# Patient Record
Sex: Female | Born: 1995 | Race: Black or African American | Hispanic: No | Marital: Single | Smoking: Never smoker
Health system: Southern US, Community
[De-identification: ages and names within clinical notes are randomized; demographics above are authoritative.]

---

## 2021-08-09 ENCOUNTER — Emergency Department
Admission: EM | Admit: 2021-08-09 | Discharge: 2021-08-09 | Disposition: A | Discharge status: Home / Self Care | Payer: 59 | Attending: Emergency Medicine | Admitting: Emergency Medicine

## 2021-08-09 ENCOUNTER — Emergency Department: Payer: 59

## 2021-08-09 ENCOUNTER — Other Ambulatory Visit: Payer: Self-pay

## 2021-08-09 DIAGNOSIS — T50901A Poisoning by unspecified drugs, medicaments and biological substances, accidental (unintentional), initial encounter: Secondary | ICD-10-CM | POA: Diagnosis present

## 2021-08-09 DIAGNOSIS — Y900 Blood alcohol level of less than 20 mg/100 ml: Secondary | ICD-10-CM | POA: Diagnosis not present

## 2021-08-09 DIAGNOSIS — Y990 Civilian activity done for income or pay: Secondary | ICD-10-CM | POA: Insufficient documentation

## 2021-08-09 DIAGNOSIS — T50904A Poisoning by unspecified drugs, medicaments and biological substances, undetermined, initial encounter: Secondary | ICD-10-CM

## 2021-08-09 DIAGNOSIS — R4182 Altered mental status, unspecified: Secondary | ICD-10-CM | POA: Diagnosis not present

## 2021-08-09 LAB — CBC WITH DIFFERENTIAL/PLATELET
Abs Immature Granulocytes: 0.01 10*3/uL (ref 0.00–0.07)
Basophils Absolute: 0 10*3/uL (ref 0.0–0.1)
Basophils Relative: 1 %
Eosinophils Absolute: 0.1 10*3/uL (ref 0.0–0.5)
Eosinophils Relative: 2 %
HCT: 35.6 % — ABNORMAL LOW (ref 36.0–46.0)
Hemoglobin: 11 g/dL — ABNORMAL LOW (ref 12.0–15.0)
Immature Granulocytes: 0 %
Lymphocytes Relative: 48 %
Lymphs Abs: 2.9 10*3/uL (ref 0.7–4.0)
MCH: 24.5 pg — ABNORMAL LOW (ref 26.0–34.0)
MCHC: 30.9 g/dL (ref 30.0–36.0)
MCV: 79.3 fL — ABNORMAL LOW (ref 80.0–100.0)
Monocytes Absolute: 0.4 10*3/uL (ref 0.1–1.0)
Monocytes Relative: 8 %
Neutro Abs: 2.4 10*3/uL (ref 1.7–7.7)
Neutrophils Relative %: 41 %
Platelets: 303 10*3/uL (ref 150–400)
RBC: 4.49 MIL/uL (ref 3.87–5.11)
RDW: 13.3 % (ref 11.5–15.5)
WBC: 5.9 10*3/uL (ref 4.0–10.5)
nRBC: 0 % (ref 0.0–0.2)

## 2021-08-09 LAB — COMPREHENSIVE METABOLIC PANEL
ALT: 13 U/L (ref 0–44)
AST: 28 U/L (ref 15–41)
Albumin: 4.3 g/dL (ref 3.5–5.0)
Alkaline Phosphatase: 61 U/L (ref 38–126)
Anion gap: 6 (ref 5–15)
BUN: 14 mg/dL (ref 6–20)
CO2: 29 mmol/L (ref 22–32)
Calcium: 9.1 mg/dL (ref 8.9–10.3)
Chloride: 103 mmol/L (ref 98–111)
Creatinine, Ser: 0.81 mg/dL (ref 0.44–1.00)
GFR, Estimated: >60 mL/min (ref >60)
Glucose, Bld: 104 mg/dL — ABNORMAL HIGH (ref 70–99)
Potassium: 4.9 mmol/L (ref 3.5–5.1)
Sodium: 138 mmol/L (ref 135–145)
Total Bilirubin: 1.3 mg/dL — ABNORMAL HIGH (ref 0.3–1.2)
Total Protein: 8.5 g/dL — ABNORMAL HIGH (ref 6.5–8.1)

## 2021-08-09 LAB — URINE DRUG SCREEN, QUALITATIVE (ARMC ONLY)
Amphetamines, Ur Screen: NOT DETECTED
Barbiturates, Ur Screen: NOT DETECTED
Benzodiazepine, Ur Scrn: NOT DETECTED
Cannabinoid 50 Ng, Ur ~~LOC~~: POSITIVE — AB
Cocaine Metabolite,Ur ~~LOC~~: NOT DETECTED
MDMA (Ecstasy)Ur Screen: NOT DETECTED
Methadone Scn, Ur: NOT DETECTED
Opiate, Ur Screen: NOT DETECTED
Phencyclidine (PCP) Ur S: NOT DETECTED
Tricyclic, Ur Screen: NOT DETECTED

## 2021-08-09 LAB — HCG, QUANTITATIVE, PREGNANCY: hCG, Beta Chain, Quant, S: 1 m[IU]/mL (ref <5)

## 2021-08-09 LAB — ETHANOL: Alcohol, Ethyl (B): <10 mg/dL (ref <10)

## 2021-08-09 LAB — ACETAMINOPHEN LEVEL: Acetaminophen (Tylenol), Serum: <10 ug/mL — ABNORMAL LOW (ref 10–30)

## 2021-08-09 NOTE — ED Provider Notes (Signed)
[facility] Emergency Department Provider Note  ____________________________________________   Event Date/Time   First MD Initiated Contact with Patient 08/09/21 1922     (approximate)  I have reviewed the triage vital signs and the nursing notes.   HISTORY  Chief Complaint Drug Overdose   HPI Candice Wallace is a 25 y.o. female patient presents via EMS from [place] where she is apparently on police after she was found by coworkers slumped over at her desk.  There was reportedly another individual who was also transferred emergency room with similar symptoms around the same time.  Coworkers were concerned about possible drug overdose although EMS states did not see any drug paraphernalia and patient would refuse to answer any questions with them.  She received no medications in route reportedly had stable vitals.  On arrival patient does state she took some drugs today and is able to state her name but is otherwise somewhat altered or intoxicated and unable to answer any additional questions about her medical history or events today.  He was endorsing some pain at the site of an IV but denies any other acute pain.  No other history is immediately available.         History reviewed. No pertinent past medical history.  There are no problems to display for this patient.     Prior to Admission medications   Not on File    Allergies Patient has no allergy information on record.  History reviewed. No pertinent family history.  Social History    Review of Systems  Review of Systems  Unable to perform ROS: Mental status change     ____________________________________________   PHYSICAL EXAM:  VITAL SIGNS: ED Triage Vitals  Enc Vitals Group     BP      Pulse      Resp      Temp      Temp src      SpO2      Weight      Height      Head Circumference      Peak Flow      Pain Score      Pain Loc      Pain Edu?      Excl. in GC?     Vitals:   08/09/21 1930 08/09/21 2100  BP: 132/88 114/83  Pulse: 94 85  Resp: 19 18  Temp: 98.2 F (36.8 C)   SpO2: 99% 100%   Physical Exam Vitals and nursing note reviewed.  Constitutional:      General: She is not in acute distress.    Appearance: She is well-developed.  HENT:     Head: Normocephalic and atraumatic.     Right Ear: External ear normal.     Left Ear: External ear normal.     Nose: Nose normal.  Eyes:     Conjunctiva/sclera: Conjunctivae normal.  Cardiovascular:     Rate and Rhythm: Normal rate and regular rhythm.     Heart sounds: No murmur heard. Pulmonary:     Effort: Pulmonary effort is normal. No respiratory distress.     Breath sounds: Normal breath sounds.  Abdominal:     Palpations: Abdomen is soft.     Tenderness: There is no abdominal tenderness.  Musculoskeletal:     Cervical back: Neck supple.  Skin:    General: Skin is warm and dry.     Capillary Refill: Capillary refill takes less than 2 seconds.  Neurological:  Mental Status: She is lethargic.     GCS: GCS eye subscore is 4. GCS verbal subscore is 4. GCS motor subscore is 5.     Cranial Nerves: No cranial nerve deficit.    No obvious trauma to the face scalp head neck or extremities.  Abdomen is soft.  Lungs are clear bilaterally.  Pupils are 3 mm and reactive bilaterally.  Oropharynx is unremarkable.  Patient withdraws all extremities to noxious stimuli.  She will open her eyes to loud verbal stimuli. ____________________________________________   LABS (all labs ordered are listed, but only abnormal results are displayed)  Labs Reviewed  COMPREHENSIVE METABOLIC PANEL - Abnormal; Notable for the following components:      Result Value   Glucose, Bld 104 (*)    Total Protein 8.5 (*)    Total Bilirubin 1.3 (*)    All other components within normal limits  URINE DRUG SCREEN, QUALITATIVE ([facility] ONLY) - Abnormal; Notable for the following components:   Cannabinoid 50 Ng, Ur [place]  POSITIVE (*)    All other components within normal limits  CBC WITH DIFFERENTIAL/PLATELET - Abnormal; Notable for the following components:   Hemoglobin 11.0 (*)    HCT 35.6 (*)    MCV 79.3 (*)    MCH 24.5 (*)    All other components within normal limits  ACETAMINOPHEN LEVEL - Abnormal; Notable for the following components:   Acetaminophen (Tylenol), Serum <10 (*)    All other components within normal limits  ETHANOL  HCG, QUANTITATIVE, PREGNANCY  POC URINE PREG, ED   ____________________________________________  EKG  ECG remarkable for sinus rhythm with a ventricular rate of 90, otherwise unremarkable intervals without evidence of acute ischemia or significant arrhythmia. ____________________________________________  RADIOLOGY  ED MD interpretation: CT head without evidence of hemorrhage, significant large ischemia, mass-effect, edema or other acute process.  Official radiology report(s): CT HEAD WO CONTRAST ( )  Result Date: 08/09/2021 CLINICAL DATA:  Altered mental status EXAM: CT HEAD WITHOUT CONTRAST TECHNIQUE: Contiguous axial images were obtained from the base of the skull through the vertex without intravenous contrast. COMPARISON:  None. FINDINGS: Brain: Normal anatomic configuration. No abnormal intra or extra-axial mass lesion or fluid collection. No abnormal mass effect or midline shift. No evidence of acute intracranial hemorrhage or infarct. Ventricular size is normal. Cerebellum unremarkable. Vascular: Unremarkable Skull: Intact Sinuses/Orbits: Paranasal sinuses are clear. Orbits are unremarkable. Other: Mastoid air cells and middle ear cavities are clear. IMPRESSION: Normal head CT.  No acute intracranial abnormality. Electronically Signed   By: Helyn Numbers M.D.   On: 08/09/2021 20:28    ____________________________________________   PROCEDURES  Procedure(s) performed (including Critical Care):  .1-3 Lead EKG Interpretation Performed by: Gilles Chiquito,  MD Authorized by: Gilles Chiquito, MD     Interpretation: normal     ECG rate assessment: normal     Rhythm: sinus rhythm     Ectopy: none     Conduction: normal     ____________________________________________   INITIAL IMPRESSION / ASSESSMENT AND PLAN / ED COURSE      Patient presents with above-stated history and exam fairly somnolent from work she was found some toe at her desk.  She is only able to state her name and that she is not in significant pain that she took some drugs but unable to find any additional contributory history.  On arrival she is afebrile hemodynamically stable on room air.  She has a GCS of around 11.  No history or exam  features to suggest acute significant trauma.  No obvious findings of foci of infection on exam or fever.  Additional differential includes possible acute intracranial hemorrhage, intoxication from intentional or accidental drug overdose, metabolic derangements.  CT head is grossly unremarkable.  No evidence of ischemia or hemorrhage.  CMP without significant electrolyte or metabolic derangements.  Serum ethanol and acetaminophen undetectable.  hCG is 1.  CBC without leukocytosis and normal platelets.  Hemoglobin is 11 without recent to compare to.  UDS is positive for THC.  On several reassessments patient has improvement of her mental status.  She is known to have full strength in all extremities and was observed to ambulate with steady gait unassisted.  She is alert and oriented to she does not remember what happened earlier.  She denies any SI HI or hallucinations.  Denies any preceding sick symptoms and states she currently feels completely fine back to baseline.  I suspect likely accidental drug overdose.  Given she is back to baseline with stable vitals and has not required any reversal medications and is denying any SI I think she is stable for discharge.  While she is denying any illicit drug use counseled on importance of avoiding such  including THC.  Recommended outpatient PCP follow-up as needed.  Discharged in stable condition.       ____________________________________________   FINAL CLINICAL IMPRESSION(S) / ED DIAGNOSES  Final diagnoses:  Altered mental status, unspecified altered mental status type  Drug overdose of undetermined intent, initial encounter    Medications - No data to display   ED Discharge Orders     None        Note:  This document was prepared using Dragon voice recognition software and may include unintentional dictation errors.    Gilles Chiquito, MD 08/09/21 2141

## 2021-08-09 NOTE — ED Triage Notes (Signed)
Pt presents to ER via ems from work.  Per ems, they were called out for sick person d/t pt being slumped over at her desk at work.  Pt non-verbal w/ems.  VSS en route.  When asked what happed today, pt states "nothing."  Pt denies any illicit substance use.

## 2021-08-14 ENCOUNTER — Telehealth: Payer: Self-pay

## 2021-08-14 NOTE — Telephone Encounter (Signed)
Called to set up an appt for this pt the vmail hasnt been set up yet

## 2021-09-11 ENCOUNTER — Encounter: Payer: 59 | Admitting: Obstetrics & Gynecology

## 2021-10-30 ENCOUNTER — Other Ambulatory Visit: Payer: Self-pay | Admitting: Nurse Practitioner

## 2021-10-30 DIAGNOSIS — N644 Mastodynia: Secondary | ICD-10-CM

## 2021-11-16 NOTE — Progress Notes (Signed)
° °  Candice Wallace October 20, 1995 [id number]   History:  26 y.o. G 0 presents for annual exam.Unsure if she had pap 2022--very painful exam (never had sexual intercourse) Complains of vaginal odor, no abnormal discharge, no itching.  Also complains of large labia (wants to discuss treatment options). Periods are heavy with Sprintec, would like another option.  Gynecologic History Patient's last menstrual period was 10/18/2021. Period Cycle (Days): 28 Period Duration (Days): 5 Period Pattern: Regular (on ocps to regulate periods) Menstrual Flow: Moderate Dysmenorrhea: None Contraception/Family planning: abstinence Sexually active: no Last Pap: ?2022. Results were: unsure  Obstetric History OB History  Gravida Para Term Preterm AB Living  0 0 0 0 0 0  SAB IAB Ectopic Multiple Live Births  0 0 0 0 0     The following portions of the patient's history were reviewed and updated as appropriate: allergies, current medications, past family history, past medical history, past social history, past surgical history, and problem list.  Review of Systems Pertinent items noted in HPI and remainder of comprehensive ROS otherwise negative.   Past medical history, past surgical history, family history and social history were all reviewed and documented in the EPIC chart.  ROS:  A ROS was performed and pertinent positives and negatives are included.  Exam:  Vitals:   11/17/21 1120  BP: 118/78  Weight: 164 lb (74.4 kg)  Height: 5' 3.5" (1.613 m)   Body mass index is 28.6 kg/m.  General appearance:  Normal Thyroid:  Symmetrical, normal in size, without palpable masses or nodularity. Respiratory  Auscultation:  Clear without wheezing or rhonchi Cardiovascular  Auscultation:  Regular rate, without rubs, murmurs or gallops  Edema/varicosities:  Not grossly evident Abdominal  Soft,nontender, without masses, guarding or rebound.  Liver/spleen:  No organomegaly noted  Hernia:  None  appreciated  Skin  Inspection:  Grossly normal Breasts: Examined lying and sitting.   Right: Without masses, retractions, nipple discharge or axillary adenopathy.   Left: Without masses, retractions, nipple discharge or axillary adenopathy. Genitourinary   Inguinal/mons:  Normal without inguinal adenopathy  External genitalia:  Normal appearing vulva with no masses, tenderness, or lesions  BUS/Urethra/Skene's glands:  Normal without masses or exudate  Vagina:  Normal appearing with normal color no lesions. Discharge; white thin, Sample taken for wet mount.  Cervix:  Normal appearing without discharge or lesions  Uterus:  Normal in size, shape and contour.  Mobile, nontender  Adnexa/parametria:     Rt: Normal in size, without masses or tenderness.   Lt: Normal in size, without masses or tenderness.  Anus and perineum: Normal  Digital rectal exam: Normal sphincter tone without palpated masses or tenderness  Microscopic wet-mount exam shows hyphae.   Patient informed chaperone available to be present for breast and pelvic exam. Patient has requested no chaperone to be present. Patient has been advised what will be completed during breast and pelvic exam.   Assessment/Plan:   -Well woman exam -Vaginitis - Rx sent for fluconazole  -Menorrhagia- change to Junel, finish current pack  - reassured no labial hyperplasia  Discussed SBE, colonoscopy and DEXA screening as directed/appropriate. Recommend of exercise weekly, including weight bearing exercise. Encouraged the use of seatbelts and sunscreen. Return in 1 year for annual or as needed.   Arlie Solomons B WHNP-BC 12:07 PM 11/17/2021

## 2021-11-17 ENCOUNTER — Other Ambulatory Visit: Payer: Self-pay

## 2021-11-17 ENCOUNTER — Encounter: Payer: Self-pay | Admitting: Radiology

## 2021-11-17 ENCOUNTER — Other Ambulatory Visit (HOSPITAL_COMMUNITY)
Admission: RE | Admit: 2021-11-17 | Discharge: 2021-11-17 | Disposition: A | Discharge status: Home / Self Care | Payer: 59 | Source: Ambulatory Visit | Attending: Radiology | Admitting: Radiology

## 2021-11-17 ENCOUNTER — Ambulatory Visit (INDEPENDENT_AMBULATORY_CARE_PROVIDER_SITE_OTHER): Payer: 59 | Admitting: Radiology

## 2021-11-17 VITALS — BP 118/78 | Ht 63.5 in | Wt 164.0 lb

## 2021-11-17 DIAGNOSIS — Z01419 Encounter for gynecological examination (general) (routine) without abnormal findings: Secondary | ICD-10-CM | POA: Diagnosis not present

## 2021-11-17 DIAGNOSIS — N898 Other specified noninflammatory disorders of vagina: Secondary | ICD-10-CM | POA: Diagnosis not present

## 2021-11-17 LAB — WET PREP FOR TRICH, YEAST, CLUE

## 2021-11-17 MED ORDER — FLUCONAZOLE 150 MG PO TABS: 150.0000 mg | ORAL_TABLET | Freq: Once | ORAL | 0 refills | Status: AC

## 2021-11-17 MED ORDER — NORETHIN ACE-ETH ESTRAD-FE 1-20 MG-MCG PO TABS: 1.0000 | ORAL_TABLET | Freq: Every day | ORAL | 4 refills | Status: AC

## 2021-11-20 LAB — CYTOLOGY - PAP
Adequacy: ABSENT
Diagnosis: NEGATIVE

## 2021-11-22 ENCOUNTER — Telehealth: Payer: Self-pay

## 2021-11-22 NOTE — Telephone Encounter (Signed)
Her odor is not vaginal as I explained to her it is coming from sweat in the groin. I recommend she try LUME every day after bathing.

## 2021-11-22 NOTE — Telephone Encounter (Signed)
Patient informed. 

## 2021-11-22 NOTE — Telephone Encounter (Signed)
Patient called because she said [name], NP recommended an OTC gel for her vaginal odor and she has forgotten the name of it.

## 2022-03-27 NOTE — Telephone Encounter (Signed)
Please schedule for visit, will need a wet prep, she was treated for yeast at her last visit, sound like BV now.

## 2022-03-27 NOTE — Telephone Encounter (Signed)
Appointments please reach out to patient via my chart to schedule an office visit for vaginal infection.

## 2022-03-28 ENCOUNTER — Ambulatory Visit: Payer: 59 | Admitting: Radiology

## 2022-03-28 NOTE — Telephone Encounter (Signed)
Let's wait until period is over to have her come in

## 2022-04-03 ENCOUNTER — Ambulatory Visit: Payer: 59 | Admitting: Radiology

## 2022-04-03 ENCOUNTER — Ambulatory Visit (INDEPENDENT_AMBULATORY_CARE_PROVIDER_SITE_OTHER): Payer: 59 | Admitting: Radiology

## 2022-04-03 VITALS — BP 118/78

## 2022-04-03 DIAGNOSIS — N898 Other specified noninflammatory disorders of vagina: Secondary | ICD-10-CM

## 2022-04-03 LAB — WET PREP FOR TRICH, YEAST, CLUE

## 2022-04-03 NOTE — Progress Notes (Signed)
      Subjective: Candice Wallace is a 26 y.o. female who complains of vaginal odor x 2 months No d/c or itching. No new partners, is using a pH wash. Was treated for yeast at [facility] 11/2021  Review of Systems  All other systems reviewed and are negative.    Objective:  -Vulva: without lesions or discharge -Vagina: discharge present,wet prep obtained -Cervix: no lesion or discharge, no CMT -Perineum: no lesions -Uterus: Mobile, non tender -Adnexa: no masses or tenderness   Microscopic wet-mount exam shows negative for pathogens, normal epithelial cells.   Chaperone offered and declined.  Assessment:/Plan:   1. Vaginal odor Reassured negative wet prep Smell seems to be coming from sweat in groin Panoxyls 10% wash daily to groin, not vulva.  - WET PREP FOR TRICH, YEAST, CLUE   Avoid intercourse until symptoms are resolved. Safe sex encouraged. Avoid the use of soaps or perfumed products in the peri area. Avoid tub baths and sitting in sweaty or wet clothing for prolonged periods of time.

## 2022-08-21 ENCOUNTER — Encounter: Payer: No Typology Code available for payment source | Admitting: Radiology

## 2022-09-27 NOTE — Progress Notes (Signed)
erroneous

## 2022-11-16 IMAGING — CT CT HEAD W/O CM
3 series · 16 of 47 positions shown, 19 images · non-contrast
Comparison: None.

CLINICAL DATA: Altered mental status

EXAM:
CT HEAD WITHOUT CONTRAST
TECHNIQUE: Contiguous axial images were obtained from the base of the skull
through the vertex without intravenous contrast.

[Series 2: head wo · axial · 0.41mm/px · z∈[+233,+358]mm · 10 of 30 slices shown, 13 images]
[im 3/30  brain]
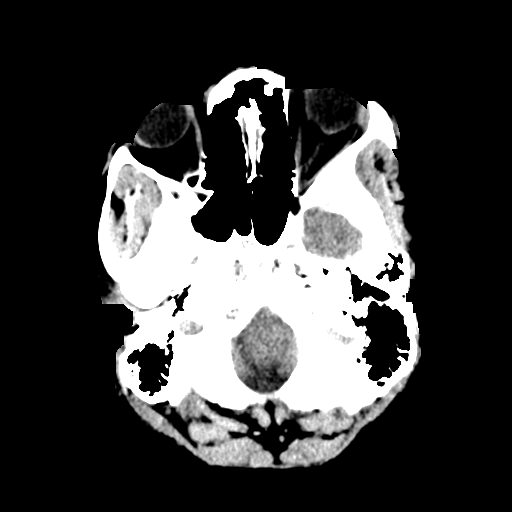
[im 3/30  bone]
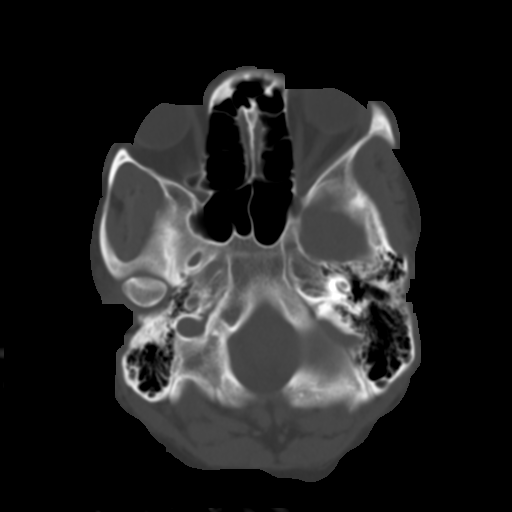
[im 6/30  brain]
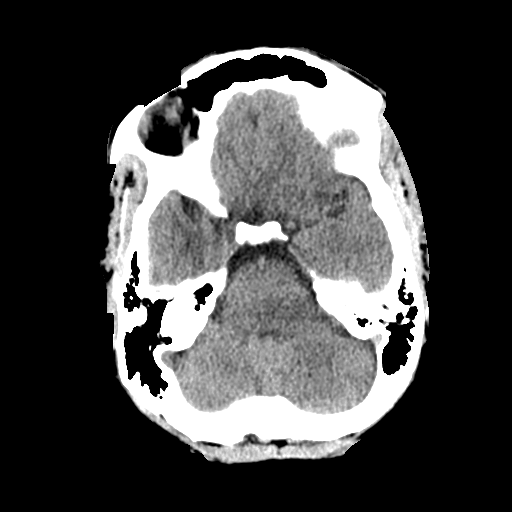
[im 9/30  brain]
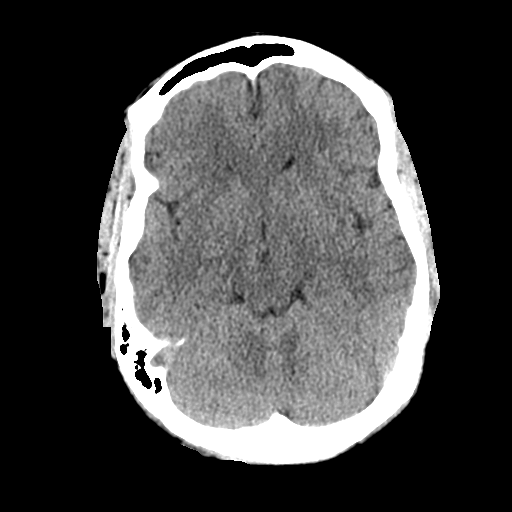
[im 11/30  brain]
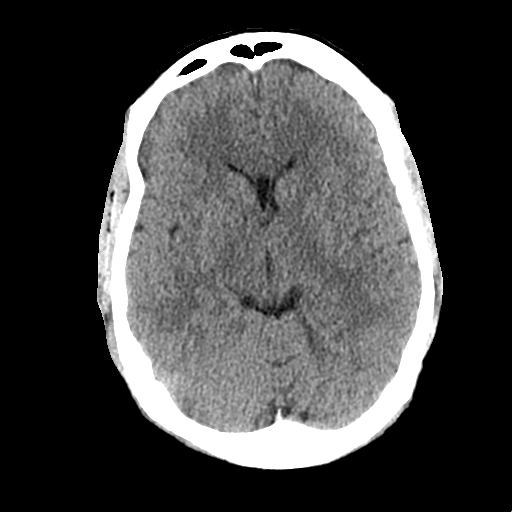
[im 14/30  brain]
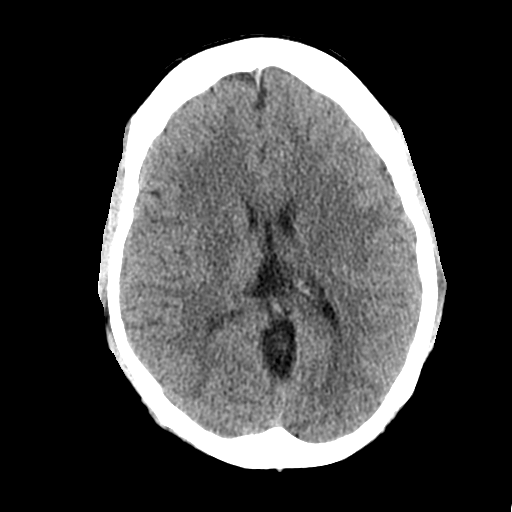
[im 14/30  bone]
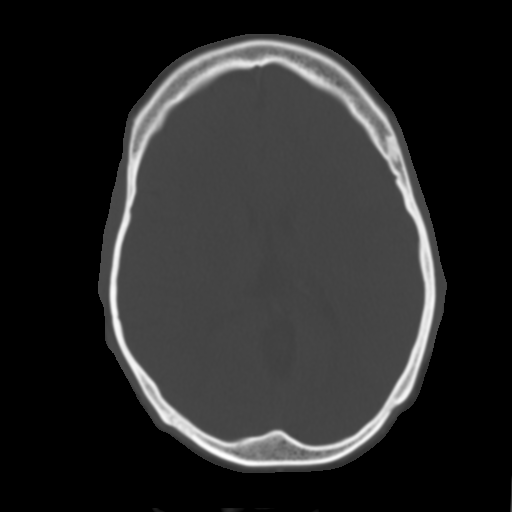
[im 17/30  brain]
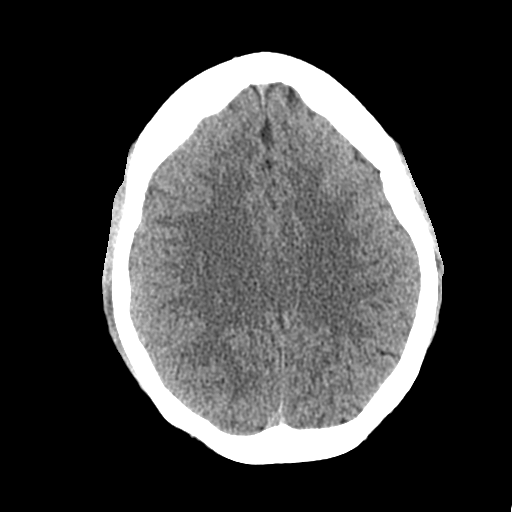
[im 20/30  brain]
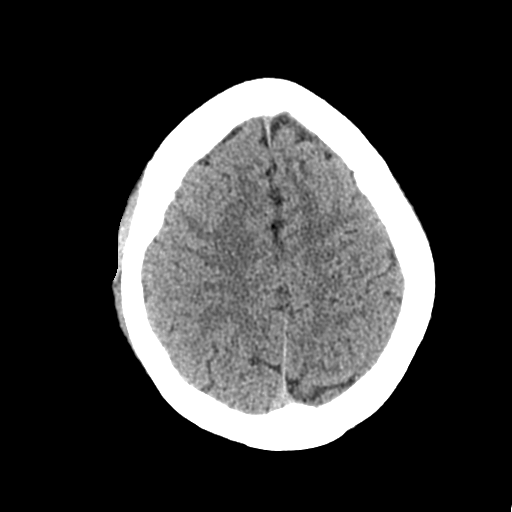
[im 23/30  brain]
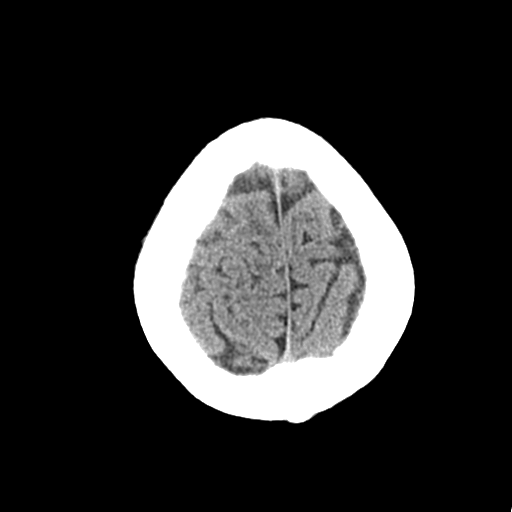
[im 25/30  brain]
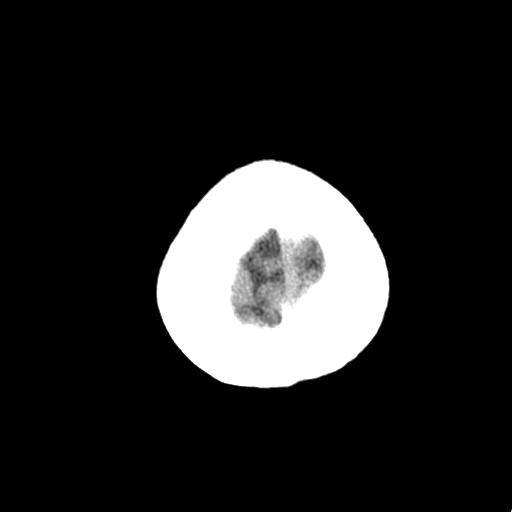
[im 25/30  bone]
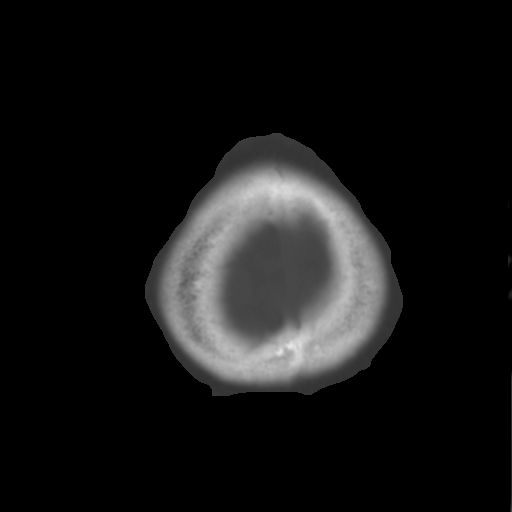
[im 28/30  brain]
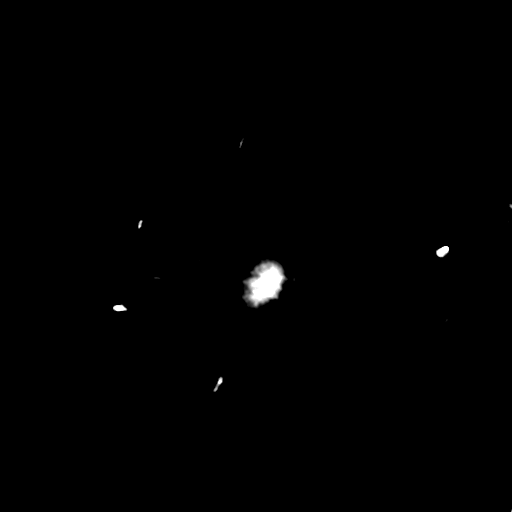

[Series 4: coronal soft tissue · coronal · 0.32mm/px · 3 of 66 slices shown]
[im 22/66  brain]
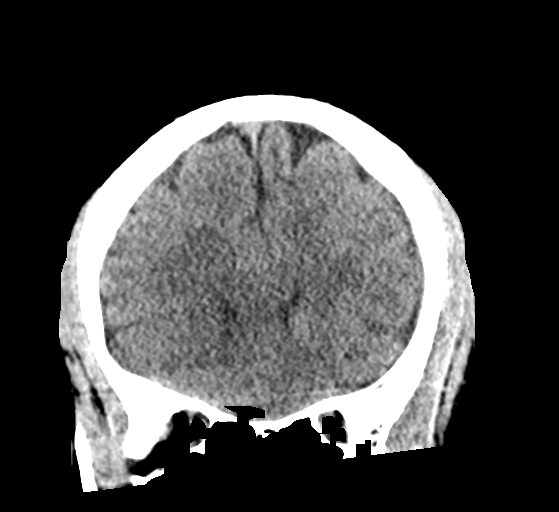
[im 29/66  brain]
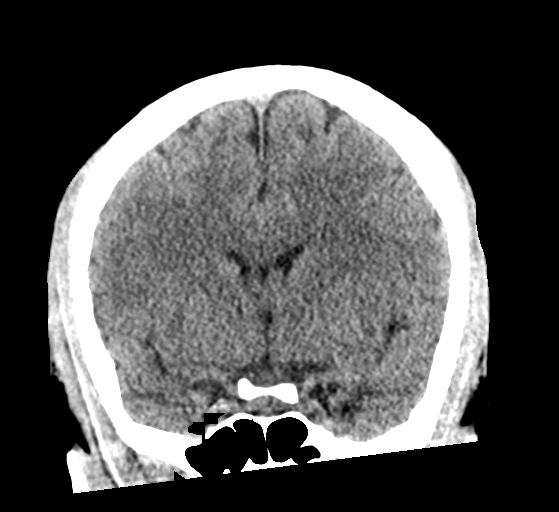
[im 37/66  brain]
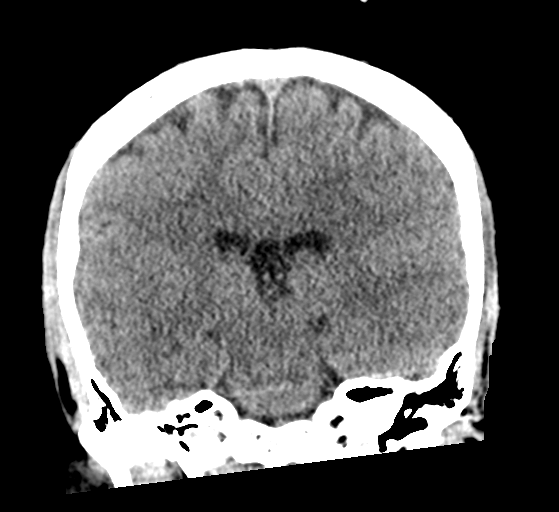

[Series 5: sagittal soft tissue · sagittal · 0.32mm/px · 3 of 57 slices shown]
[im 20/57  brain]
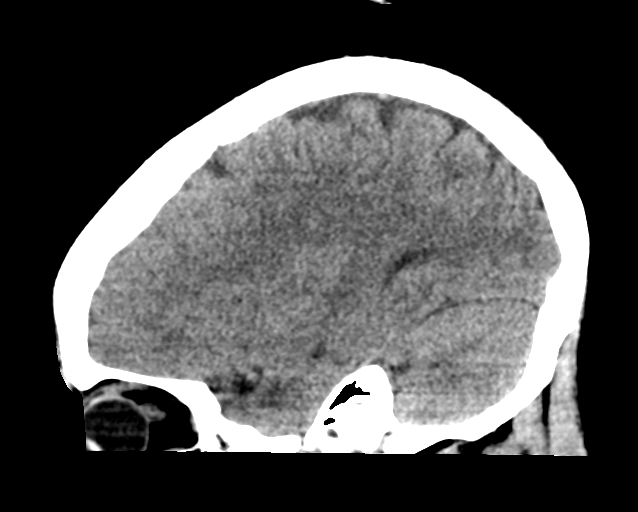
[im 29/57  brain]
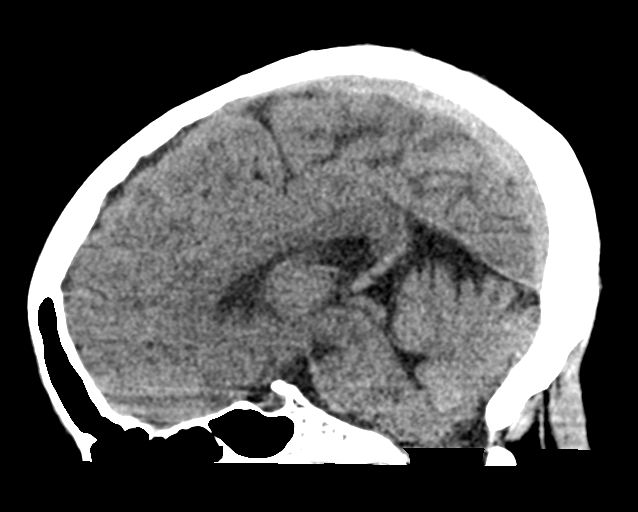
[im 37/57  brain]
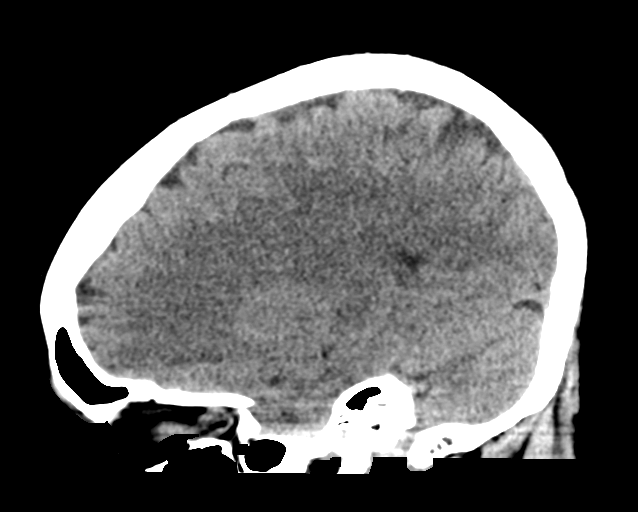

[16 of 47 positions shown; findings below may reference images not displayed]

FINDINGS: Brain: Normal anatomic configuration. No abnormal intra or
extra-axial mass lesion or fluid collection. No abnormal mass effect
or midline shift. No evidence of acute intracranial hemorrhage or
infarct. Ventricular size is normal. Cerebellum unremarkable.

Vascular: Unremarkable

Skull: Intact

Sinuses/Orbits: Paranasal sinuses are clear. Orbits are
unremarkable.

Other: Mastoid air cells and middle ear cavities are clear.
IMPRESSION: Normal head CT.  No acute intracranial abnormality.
